# Patient Record
Sex: Female | Born: 1960 | Race: White | Hispanic: No | Marital: Married | Smoking: Never smoker
Health system: Southern US, Community
[De-identification: ages and names within clinical notes are randomized; demographics above are authoritative.]

## PROBLEM LIST (undated history)

## (undated) HISTORY — PX: TYMPANOSTOMY TUBE PLACEMENT: SHX32

## (undated) HISTORY — PX: ABDOMINAL HYSTERECTOMY: SHX81

## (undated) HISTORY — PX: BACK SURGERY: SHX140

---

## 2005-01-10 ENCOUNTER — Encounter: Admission: RE | Admit: 2005-01-10 | Discharge: 2005-01-10 | Payer: Self-pay | Admitting: Orthopedic Surgery

## 2005-01-26 ENCOUNTER — Encounter: Admission: RE | Admit: 2005-01-26 | Discharge: 2005-01-26 | Payer: Self-pay | Admitting: Orthopedic Surgery

## 2005-03-09 ENCOUNTER — Inpatient Hospital Stay (HOSPITAL_COMMUNITY): Admission: RE | Admit: 2005-03-09 | Discharge: 2005-03-12 | Payer: Self-pay | Admitting: Neurosurgery

## 2010-12-31 ENCOUNTER — Encounter: Payer: Self-pay | Admitting: Orthopedic Surgery

## 2015-07-13 ENCOUNTER — Other Ambulatory Visit: Payer: Self-pay | Admitting: Family Medicine

## 2015-07-13 DIAGNOSIS — M5416 Radiculopathy, lumbar region: Secondary | ICD-10-CM

## 2015-07-19 ENCOUNTER — Ambulatory Visit
Admission: RE | Admit: 2015-07-19 | Discharge: 2015-07-19 | Disposition: A | Discharge status: Home / Self Care | Payer: PRIVATE HEALTH INSURANCE | Source: Ambulatory Visit | Attending: Family Medicine | Admitting: Family Medicine

## 2015-07-19 DIAGNOSIS — M5416 Radiculopathy, lumbar region: Secondary | ICD-10-CM

## 2015-07-19 MED ORDER — GADOBENATE DIMEGLUMINE 529 MG/ML IV SOLN
18.0000 mL | Freq: Once | INTRAVENOUS | Status: AC | PRN
  Administered 2015-07-19: 18 mL via INTRAVENOUS

## 2017-02-05 IMAGING — MR MR LUMBAR SPINE WO/W CM
8 series · 39 of 48 positions shown · IV contrast (multihance)
Comparison: [REDACTED] Lumbar MRI
10/17/2010

CLINICAL DATA: 54-year-old female with sudden onset lumbar back
pain radiating to the legs right greater than left. No known injury.
Prior surgery. Subsequent encounter.

EXAM:
MRI LUMBAR SPINE WITHOUT AND WITH CONTRAST
TECHNIQUE: Multiplanar and multiecho pulse sequences of the lumbar spine were
obtained without and with intravenous contrast.
CONTRAST:  18mL MULTIHANCE GADOBENATE DIMEGLUMINE 529 MG/ML IV SOLN

[Series 2: T1 · sagittal · 4.0mm · 0.88mm/px · 3 of 12 slices shown (1 of 3)]
[im 1/12]
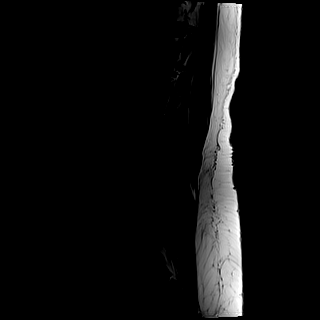
[im 6/12]
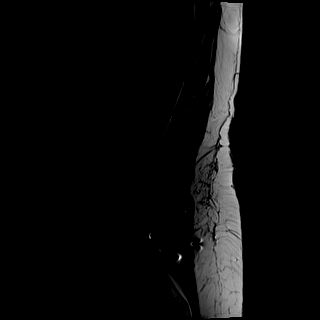
[im 12/12]
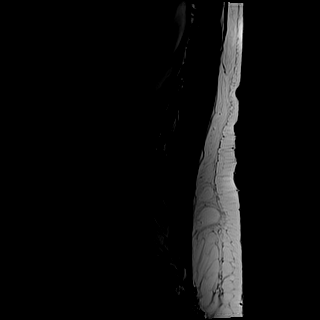

[Series 3: T1 · axial · 4.0mm · 0.74mm/px · z∈[-89,+122]mm · 9 of 33 slices shown (2 of 3)]
[im 1/33]
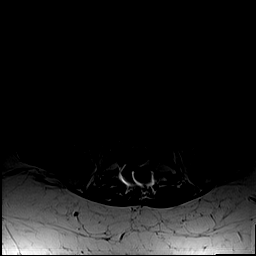
[im 5/33]
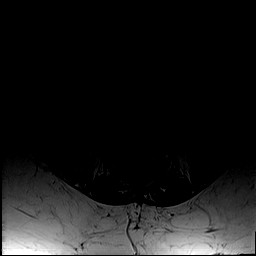
[im 9/33]
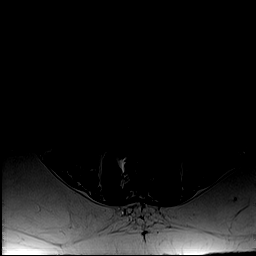
[im 13/33]
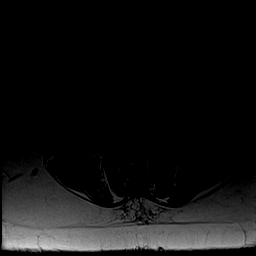
[im 17/33]
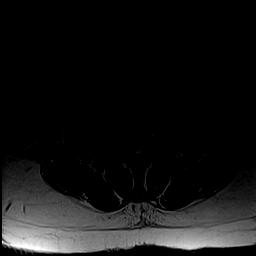
[im 21/33]
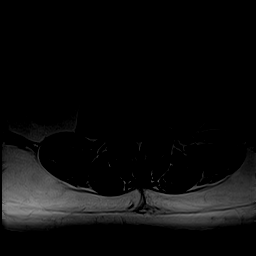
[im 25/33]
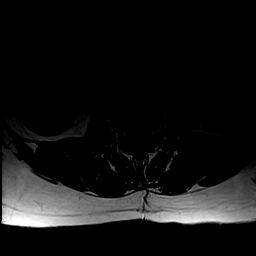
[im 29/33]
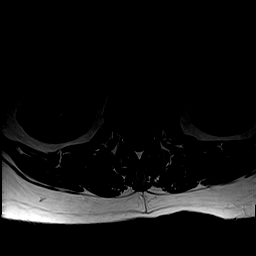
[im 33/33]
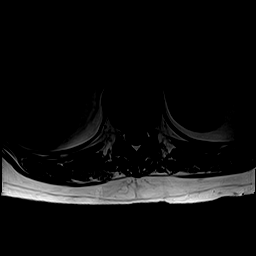

[Series 4: STIR · sagittal · 4.0mm · 0.55mm/px · 4 of 12 slices shown]
[im 1/12]
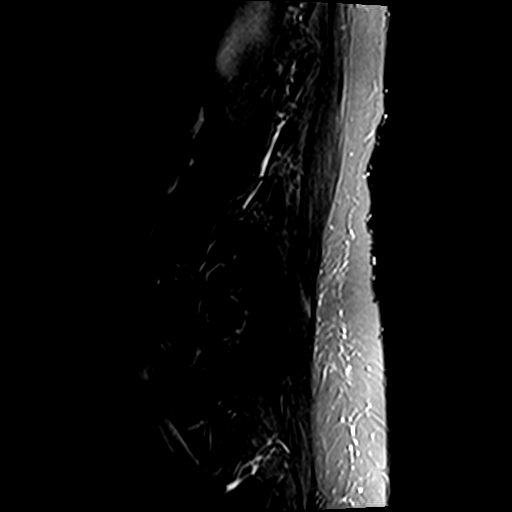
[im 4/12]
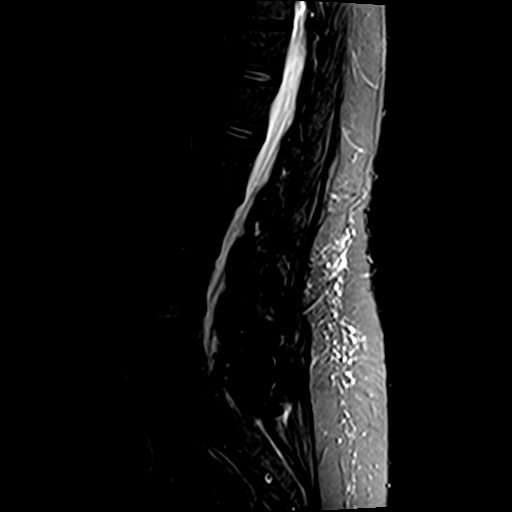
[im 8/12]
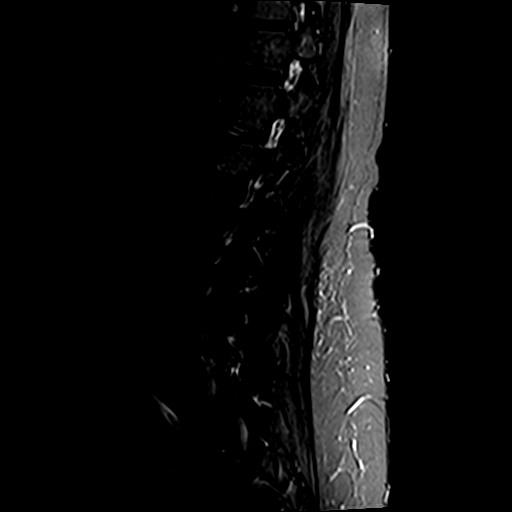
[im 12/12]
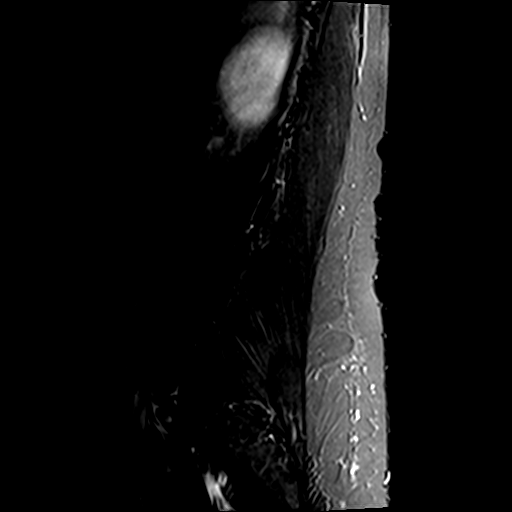

[Series 5: T2 · axial · 4.0mm · 0.74mm/px · z∈[-92,+125]mm · 8 of 33 slices shown]
[im 1/33]
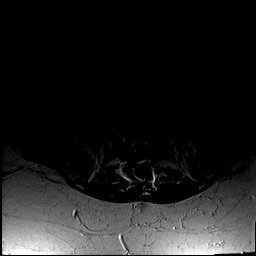
[im 4/33]
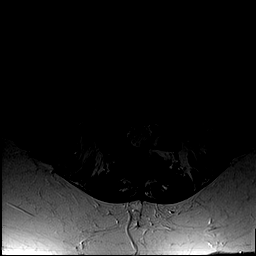
[im 11/33]
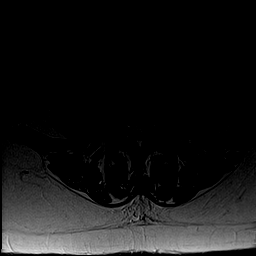
[im 15/33]
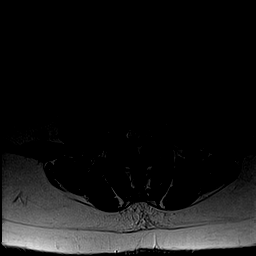
[im 18/33]
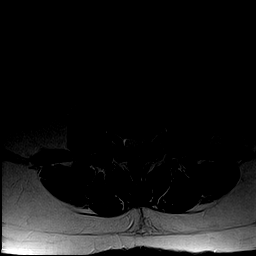
[im 22/33]
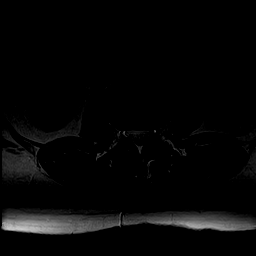
[im 29/33]
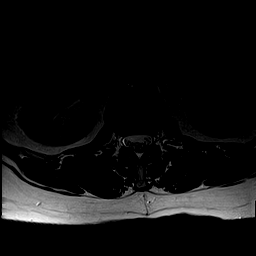
[im 33/33]
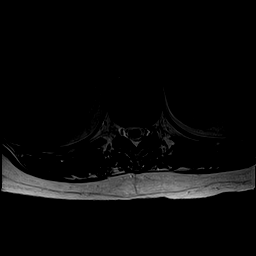

[Series 6: T2 post-contrast · sagittal · 4.0mm · 0.88mm/px · 4 of 12 slices shown]
[im 1/12]
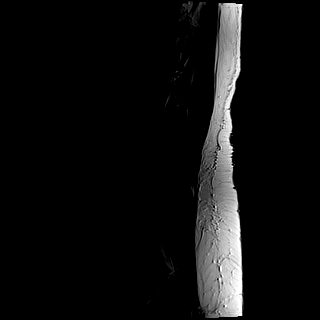
[im 4/12]
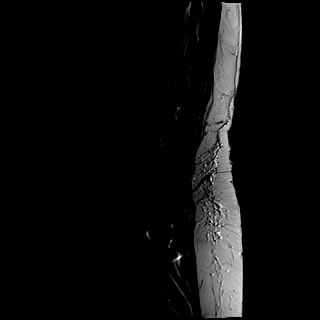
[im 8/12]
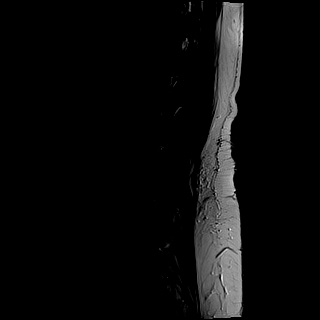
[im 12/12]
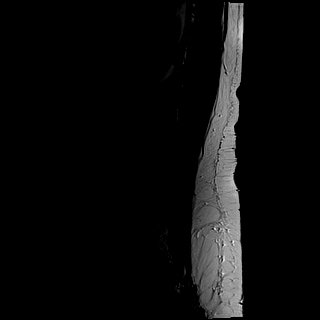

[Series 7: sag post · sagittal · 4.0mm · 0.55mm/px · 4 of 12 slices shown]
[im 1/12]
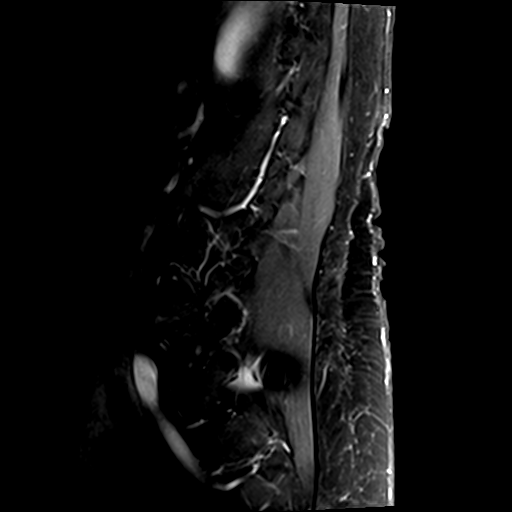
[im 4/12]
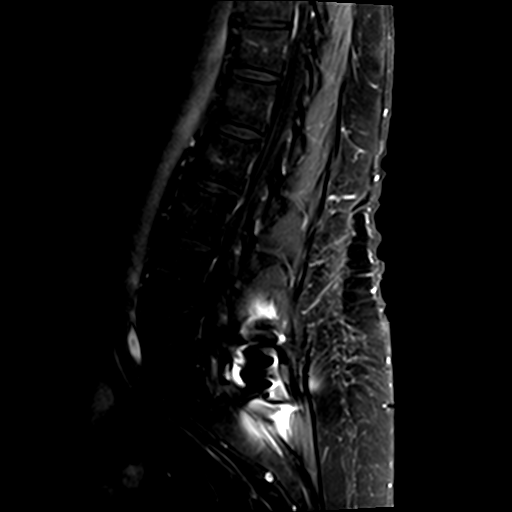
[im 8/12]
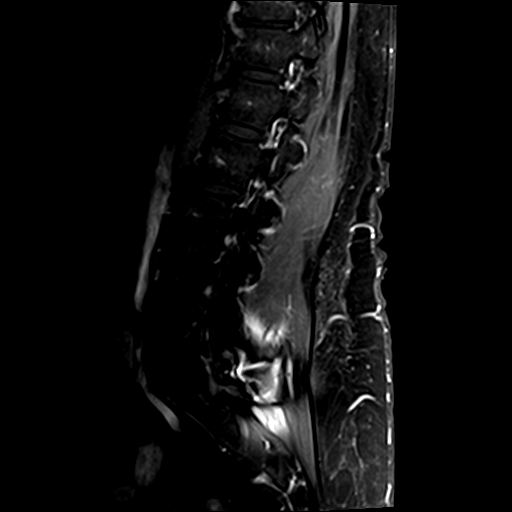
[im 12/12]
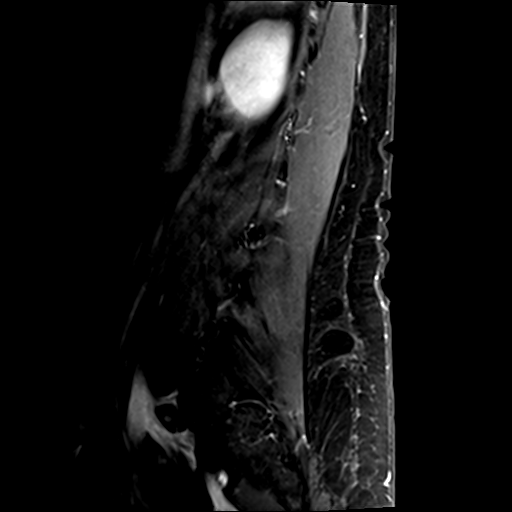

[Series 8: axial post · axial · 4.0mm · 0.74mm/px · z∈[-89,-37]mm · 3 of 33 slices shown]
[im 1/33]
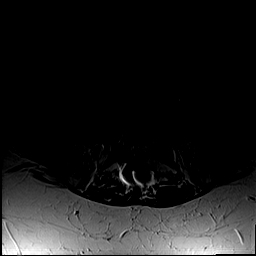
[im 4/33]
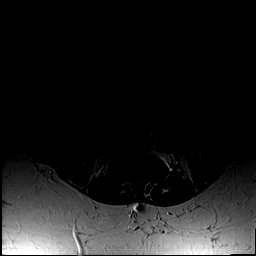
[im 11/33]
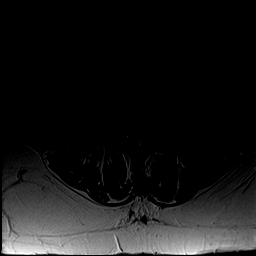

[Series 9: T1 · sagittal · 4.0mm · 0.88mm/px · 4 of 12 slices shown (3 of 3)]
[im 1/12]
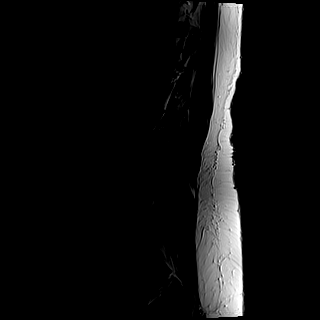
[im 4/12]
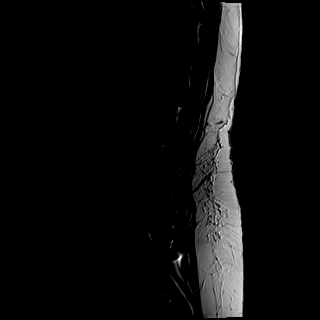
[im 8/12]
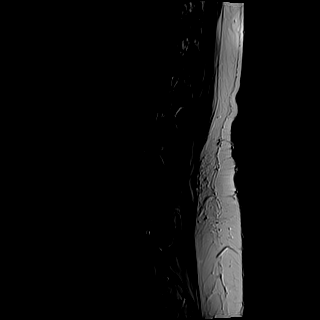
[im 12/12]
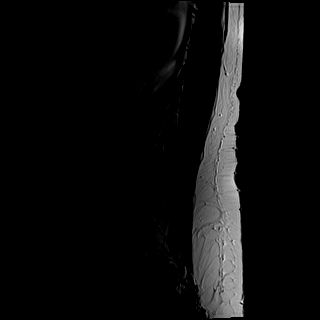

[39 of 48 positions shown; findings below may reference images not displayed]

FINDINGS: Stable lumbar vertebral height and alignment. No marrow edema or
evidence of acute osseous abnormality. Postoperative changes re-
identified at L5-S1 with mild hardware susceptibility artifact
related to a posterior interspinous spacer and interbody implant. No
abnormal enhancement identified.

Visualized lower thoracic spinal cord is normal with conus medularis
at L1-L2.

Negative visualized paraspinal soft tissues.

T11-T12: Chronic disc desiccation with mild further disc space loss
since 9899. Mild disc bulge. Mild facet hypertrophy. No stenosis.

T12-L1:  Negative.

L1-L2:  Stable minimal disc bulge.

L2-L3: Stable mild disc bulge and facet hypertrophy. No significant
stenosis.

L3-L4: Mild disc bulge and moderate facet hypertrophy of not
significantly changed. No significant stenosis.

L4-L5: Stable mild disc bulge and moderate left greater than right
facet and ligament flavum hypertrophy. Borderline to mild bilateral
lateral recess stenosis is stable.

L5-S1: Sequelae of decompression, interbody implant and interspinous
spacer placement. Stable endplate osteophytosis and residual
posterior element hypertrophy. Mild right lateral recess
architectural distortion with stable epidural enhancement. Stable
thecal sac and foraminal patency.
IMPRESSION: 1. Stable postoperative appearance of L5-S1.
2. The other lumbar levels also appear stable since 9899. No spinal
stenosis. Mild lateral recess stenosis at L4-L5.
3. Mild progression of T11-T12 disc degeneration but without
stenosis.

## 2022-08-01 ENCOUNTER — Telehealth: Payer: Self-pay

## 2022-08-01 NOTE — Telephone Encounter (Signed)
Patient was seen back in 2008 by Dr. Beaulah Dinning and her skin testing showed positive to DM and Mold. Patient thinks she has developed new allergies and she has been coughing since January which she got a kitten a few days before Christmas, but found a home for the kitten. She has an appointment with Dr. Maurine Minister tomorrow at 2:00 pm

## 2022-08-02 ENCOUNTER — Ambulatory Visit (INDEPENDENT_AMBULATORY_CARE_PROVIDER_SITE_OTHER): Payer: 59 | Admitting: Internal Medicine

## 2022-08-02 ENCOUNTER — Encounter: Payer: Self-pay | Admitting: Internal Medicine

## 2022-08-02 VITALS — BP 140/78 | HR 99 | Temp 98.4°F | Resp 18 | Ht 66.0 in | Wt 212.4 lb

## 2022-08-02 DIAGNOSIS — R053 Chronic cough: Secondary | ICD-10-CM

## 2022-08-02 DIAGNOSIS — R062 Wheezing: Secondary | ICD-10-CM | POA: Diagnosis not present

## 2022-08-02 DIAGNOSIS — J310 Chronic rhinitis: Secondary | ICD-10-CM | POA: Diagnosis not present

## 2022-08-02 MED ORDER — IPRATROPIUM BROMIDE 0.03 % NA SOLN: 2.0000 | Freq: Three times a day (TID) | NASAL | 0 refills | Status: DC | PRN

## 2022-08-02 MED ORDER — RYALTRIS 665-25 MCG/ACT NA SUSP: 2.0000 | Freq: Two times a day (BID) | NASAL | 3 refills | Status: AC | PRN

## 2022-08-02 MED ORDER — PULMICORT FLEXHALER 90 MCG/ACT IN AEPB: 1.0000 | INHALATION_SPRAY | Freq: Two times a day (BID) | RESPIRATORY_TRACT | 0 refills | Status: AC

## 2022-08-02 NOTE — Patient Instructions (Signed)
Chronic Rhinitis and Cough: - your breathing test looked great today! - come back for allergy testing - Continue Claritin (loratadine)  10 mg   daily as needed. - Consider nasal saline rinses as needed to help remove pollens, mucus and hydrate nasal mucosa - Start Ryaltris 2 sprays in each nostril up to twice daily as needed - Start Atrovent (Ipratropium Bromide) 1-2 sprays in each nostril up to 3 times a day as needed for runny nose/post nasal drip/drainage.  Use less frequently if airway becomes too dry. - Pulmicort 1 puff twice a day, use daily  Return for testing at your convenience- stop claritin and ryaltris nasal spray 3 days prior to this visit  We will test for environmental panel (1-59) and common foods (60-69)

## 2022-08-02 NOTE — Progress Notes (Signed)
NEW PATIENT Date of Service/Encounter:  08/02/22 Referring provider: none-self referred Primary care provider: No primary care provider on file.  Subjective:  Stacie Haney is a 61 y.o. female presenting today for evaluation of chronic cough. History obtained from: chart review and patient.   Chronic cough for the last few months, getting better.  Primary says she is on the back end of this and she agrees but is having clear drainage.  She would like to get allergy tested. Cough started June 30 th and she was outside cleaning papers out of her garage for around 6 hours around dust which has been a trigger for her allergies in the past.  Later that night, she developed coughing.  Also later that night, her granddaughter brought home a kitten that she was bottle feeding and sleeping with for several weeks. She is having significant drainage.  Comes out clear and watery. July 20th, she was still coughing but had improved some.  Covid testing was negative. She was evaluated and treated with a 7 day course of doxycycline and prednisone x 5 days.  She felt significant improvement with the prednisone. She did take an over the counter antihistamine yesterday.  Recently started Claritin, but had not been taking any antihistamines prior to the past few weeks. She has had environmental allergy testing 15 years ago that were positive to mold and dust mites.   She would like to be tested for cat allergies. She did give away the kitten, but does have a rag doll cat that has been with her for a year. She does not have SOB, but constant coughing and feels pressure in her ears. She does feel like she wheezes when she lies down at night.  She was using flonase and astelin but hasn't had refills in some time. She started taking in the last week over the counter claritin.  When she had COVID, she had lung issues, but this improved after having COVID-19 infection in 2022.  2023 chest x-ray showed a  lingular infiltrate likely represents pneumonia or aspiration  She does have a Food Safe lab test that was positive to multiple food allergens, but in reviewing her test she, testing was looking at IgG4 levels.  Dairy was one of the positives.  She does report having symptoms with dairy in the past-abdominal symptoms, but eats cheese on a regular basis without symptoms.  Therefore discussed that this rules out allergy to dairy.  She cannot identify any specific foods which she eats that causes any concerning symptoms.  She is not avoiding any foods.  This testing was obtained because she has noted several areas of localized swelling on her lower extremities.  She thought this inflammation might be secondary to food allergy, but has not been evaluated by orthopedics recently.  She does have a history of penicillin allergy.  Reports throat closing and difficulty breathing.  Past Medical History: History reviewed. No pertinent past medical history. Medication List:  Current Outpatient Medications  Medication Sig Dispense Refill   Budesonide (PULMICORT FLEXHALER) 90 MCG/ACT inhaler Inhale 1 puff into the lungs 2 (two) times daily. 90 day supply 3 each 0   estradiol (ESTRACE) 0.5 MG tablet Take 0.5 mg by mouth daily.     ipratropium (ATROVENT) 0.03 % nasal spray Place 2 sprays into both nostrils 3 (three) times daily as needed for rhinitis. 90 mL 0   meloxicam (MOBIC) 15 MG tablet Take 15 mg by mouth 3 (three) times daily.  Olopatadine-Mometasone (RYALTRIS) [id number] MCG/ACT SUSP Place 2 each into the nose 2 (two) times daily as needed. 29 g 3   No current facility-administered medications for this visit.   Known Allergies:  Allergies  Allergen Reactions   Penicillins Anaphylaxis, Rash and Swelling   Levofloxacin Other (See Comments)    Leg numbness, weakness in both legs  Unable to walk, has permanent ligament damage now    Past Surgical History: Past Surgical History:  Procedure Laterality  Date   ABDOMINAL HYSTERECTOMY     BACK SURGERY     TYMPANOSTOMY TUBE PLACEMENT     Family History: Family History  Problem Relation Age of Onset   Allergic rhinitis Neg Hx    Angioedema Neg Hx    Asthma Neg Hx    Atopy Neg Hx    Eczema Neg Hx    Immunodeficiency Neg Hx    Urticaria Neg Hx    Social History: Stacie Haney lives in a house built 18 years ago, no water damage, carpet floors, electric heating, central AC, indoor cat, no cockroaches, not using dust mite protection on the bedding or pillows, no smoke exposure, + HEPA filter in the home..   ROS:  All other systems negative except as noted per HPI.  Objective:  Blood pressure (!) 140/78, pulse 99, temperature 98.4 F (36.9 C), temperature source Temporal, resp. rate 18, height 5\' 6"  (1.676 m), weight 212 lb 6.4 oz (96.3 kg), SpO2 98 %. Body mass index is 34.28 kg/m. Physical Exam:  General Appearance:  Alert, cooperative, no distress, appears stated age  Head:  Normocephalic, without obvious abnormality, atraumatic  Eyes:  Conjunctiva clear, EOM's intact  Nose: Nares normal,  minimally edematous turbinate, normal mucosa, no visible anterior polyps, and septum midline  Throat: Lips, tongue normal; teeth and gums normal, normal posterior oropharynx  Neck: Supple, symmetrical  Lungs:   clear to auscultation bilaterally, Respirations unlabored, no coughing  Heart:  regular rate and rhythm and no murmur, Appears well perfused  Extremities: No edema  Skin: Skin color, texture, turgor normal, no rashes or lesions on visualized portions of skin  Neurologic: No gross deficits     Diagnostics: Spirometry:  Tracings reviewed. Her effort: Good reproducible efforts. 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  Plan as below.  We are going to do a trial of inhaled steroid given  nighttime wheezing.  Spirometry today looked great with a nonobstructive pattern. No concerns for food allergies, but patient would like reassurance.  Chronic Rhinitis and Cough: - your breathing test looked great today! - come back for allergy testing - Continue Claritin (loratadine) 10 mg  daily as needed. - Consider nasal saline rinses as needed to help remove pollens, mucus and hydrate nasal mucosa - Start Ryaltris 2 sprays in each nostril up to twice daily as needed - Start Atrovent (Ipratropium Bromide) 1-2 sprays in each nostril up to 3 times a day as needed for runny nose/post nasal drip/drainage.  Use less frequently if airway becomes too dry. - Pulmicort 1 puff twice a day, use daily  Return for testing at your convenience- stop claritin and ryaltris nasal spray 3 days prior to this visit  We will test for environmental panel (1-59) and common foods (60-69)   This note in its entirety was forwarded to the Provider who requested this consultation.  Thank you for your  kind referral. I appreciate the opportunity to take part in Ynez's care. Please do not hesitate to contact me with questions.  Sincerely,  Tonny Bollman, MD [facility]

## 2022-08-09 ENCOUNTER — Ambulatory Visit (INDEPENDENT_AMBULATORY_CARE_PROVIDER_SITE_OTHER): Payer: 59 | Admitting: Internal Medicine

## 2022-08-09 ENCOUNTER — Encounter: Payer: Self-pay | Admitting: Internal Medicine

## 2022-08-09 DIAGNOSIS — J301 Allergic rhinitis due to pollen: Secondary | ICD-10-CM

## 2022-08-09 DIAGNOSIS — K9049 Malabsorption due to intolerance, not elsewhere classified: Secondary | ICD-10-CM | POA: Diagnosis not present

## 2022-08-09 DIAGNOSIS — J310 Chronic rhinitis: Secondary | ICD-10-CM

## 2022-08-09 NOTE — Patient Instructions (Addendum)
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 - Consider nasal saline rinses as needed to help remove pollens, mucus and hydrate nasal mucosa -Continue Ryaltris 2 sprays in each nostril up to twice daily as needed -Continue Atrovent (Ipratropium Bromide) 1-2 sprays in each nostril up to 3 times a day as needed for runny nose/post nasal drip/drainage.  Use less frequently if airway becomes too dry. - Pulmicort 1 puff twice a day, use daily  Follow-up in 2 to 3 months, sooner if needed It was a pleasure seeing you in clinic today   Reducing Pollen Exposure  The American Academy of All[employer] Ast[employer] and Immun[employer] suggests the following steps to reduce your exposure to pollen during allergy seasons.    Do not hang sheets or clothing out to dry; pollen may collect on these items. Do not mow lawns or spend time around freshly cut grass; mowing stirs up pollen. Keep windows closed at night.  Keep car windows closed while driving. Minimize morning activities outdoors, a time when pollen counts are usually at their highest. Stay indoors as much as possible when pollen counts or humidity is high and on windy days when pollen tends to remain in the air longer. Use air conditioning when possible.  Many air conditioners have filters that trap the pollen spores. Use a HEPA room air filter to remove pollen form the indoor air you breathe.

## 2022-08-09 NOTE — Progress Notes (Signed)
Skin Testing: Environmental allergy panel and select foods.  Adequate controls. Results discussed with patient/family.  Airborne Adult Perc - 08/09/22 0941     Time Antigen Placed 0930    Allergen Manufacturer Waynette Buttery    Location Back    Number of Test 59    Panel 1 Select    1. Control-Buffer 50% Glycerol Negative    2. Control-Histamine 1 mg/ml 3+    3. Albumin saline Negative    4. Bahia Negative    5. [place] Negative    6. Johnson Negative    7. Kentucky Blue Negative    8. Meadow Fescue Negative    9. Perennial Rye Negative    10. Sweet Vernal Negative    11. Timothy Negative    12. Cocklebur Negative    13. Burweed Marshelder Negative    14. Ragweed, short Negative    15. Ragweed, Giant Negative    16. Plantain,  English Negative    17. Lamb's Quarters Negative    18. Sheep Sorrell Negative    19. Rough Pigweed Negative    20. Marsh Elder, Rough Negative    21. Mugwort, Common Negative    22. Ash mix Negative    23. Birch mix Negative    24. Beech American Negative    25. Box, Elder Negative    26. Cedar, red Negative    27. Cottonwood, Guinea[place] Negative    28. Elm mix Negative    29. Hickory Negative    30. Maple mix Negative    31. Oak, [place] mix Negative    32. Pecan Pollen Negative    33. Pine mix Negative    34. Sycamore Eastern Negative    35. Walnut, Black Pollen Negative    36. Alternaria alternata Negative    37. Cladosporium Herbarum Negative    38. Aspergillus mix Negative    39. Penicillium mix Negative    40. Bipolaris sorokiniana (Helminthosporium) Negative    41. Drechslera spicifera (Curvularia) Negative    42. Mucor plumbeus Negative    43. Fusarium moniliforme Negative    44. Aureobasidium pullulans (pullulara) Negative    45. Rhizopus oryzae Negative    46. Botrytis cinera Negative    47. Epicoccum nigrum Negative    48. Phoma betae Negative    49. Candida Albicans Negative    50. Trichophyton mentagrophytes Negative    51.  Mite, D Farinae  5,000 AU/ml Negative    52. Mite, D Pteronyssinus  5,000 AU/ml Negative    53. Cat Hair 10,000 BAU/ml Negative    54.  Dog Epithelia Negative    55. Mixed Feathers Negative    56. Horse Epithelia Negative    57. Cockroach, German Negative    58. Mouse Negative    59. Tobacco Leaf Negative             Intradermal - 08/09/22 0939     Time Antigen Placed 0915    Allergen Manufacturer Waynette Buttery    Location Back    Number of Test 14    Intradermal Select    Control 3+    [place] 3+    Johnson 3+    7 Grass Negative    Ragweed mix 2+    Weed mix Negative    Tree mix Negative    Mold 1 Negative    Mold 2 Negative    Mold 3 Negative    Mold 4 Negative    Cat Negative  Dog Negative    Cockroach Negative    Mite mix Negative    Other Negative             Food Adult Perc - 08/09/22 1000     Time Antigen Placed 0930    Allergen Manufacturer Waynette Buttery    Location Back    1. Peanut Negative    2. Soybean Negative    3. Wheat Negative    4. Sesame Negative    5. Milk, cow Negative    6. Egg White, Chicken Negative    7. Casein Negative    8. Shellfish Mix Negative    9. Fish Mix Negative    10. Cashew Negative             Allergy testing results were read and interpreted by myself, documented by clinical staff.

## 2022-10-25 ENCOUNTER — Other Ambulatory Visit: Payer: Self-pay | Admitting: Internal Medicine

## 2022-10-31 ENCOUNTER — Other Ambulatory Visit: Payer: Self-pay | Admitting: Internal Medicine
